# Patient Record
Sex: Male | Born: 1980 | Race: White | Hispanic: No | Marital: Married | Smoking: Current some day smoker
Health system: Southern US, Community
[De-identification: ages and names within clinical notes are randomized; demographics above are authoritative.]

## PROBLEM LIST (undated history)

## (undated) DIAGNOSIS — H02409 Unspecified ptosis of unspecified eyelid: Secondary | ICD-10-CM

## (undated) HISTORY — DX: Unspecified ptosis of unspecified eyelid: H02.409

---
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
## 2014-02-08 ENCOUNTER — Encounter: Payer: Self-pay | Admitting: Neurology

## 2014-02-09 ENCOUNTER — Encounter: Payer: Self-pay | Admitting: Neurology

## 2014-02-09 ENCOUNTER — Encounter (INDEPENDENT_AMBULATORY_CARE_PROVIDER_SITE_OTHER): Payer: Self-pay

## 2014-02-09 ENCOUNTER — Ambulatory Visit (INDEPENDENT_AMBULATORY_CARE_PROVIDER_SITE_OTHER): Payer: BC Managed Care – PPO | Admitting: Neurology

## 2014-02-09 VITALS — BP 118/75 | HR 64 | Ht 69.0 in | Wt 174.0 lb

## 2014-02-09 DIAGNOSIS — H02409 Unspecified ptosis of unspecified eyelid: Secondary | ICD-10-CM | POA: Insufficient documentation

## 2014-02-09 NOTE — Progress Notes (Signed)
[facility]    Provider:  Dr Hosie PoissonSumner Referring Provider: Farris HasMorrow, Aaron, MD Primary Care Physician:  Farris HasMORROW, AARON, MD  CC: ptosis  HPI:  Derek Bird is a 33 y.o. male here as a referral from Dr. Kateri PlummerMorrow for ptosis evaluation  Notes drooping of his left eyelid. Initially noticed symptoms a few years ago, progressively worse the past few months. Notes it gets worse as the day goes on, when fatigued or having a drink. Also notes a sensation of pressure behind his eye and sensation of a heavy eyelid. On severe days he will notice some drooping but more sensation of drooping. No change with vision. No noted swelling around his eye. No noted conjunctival injection. No noted change in pupil size. Does note some generalized fatigue as the day goes on. No noted headaches. Does have a history of palpitations, typically around once per week. No episodes of diaphoresis. Of note, he describes worsening of symptoms with EtOH intake.   Overall healthy. No family history of autoimmune or neurological disorders.   Review of Systems: Out of a complete 14 system review, the patient complains of only the following symptoms, and all other reviewed systems are negative. + eye pain, palpitations, anxiety, allergies  History   Social History  . Marital Status: Married    Spouse Name: N/A    Number of Children: 0  . Years of Education: N/A   Occupational History  . Not on file.   Social History Main Topics  . Smoking status: Current Some Day Smoker    Types: Cigars  . Smokeless tobacco: Never Used  . Alcohol Use: Yes     Comment: beer, wine 8-10 drinks a week  . Drug Use: No  . Sexual Activity: Not on file   Other Topics Concern  . Not on file   Social History Narrative   Patient is married, no children   Engineer, maintenance (IT)College graduate, works as a Art gallery managerngineer   Right handed   No caffeine, drinks decaff    No family history on file.  Past Medical History  Diagnosis Date  . Ptosis      Past Surgical History  Procedure Laterality Date  . Nose surgery  1999    broken nose  . Shoulder surgery Left 2001    No current outpatient prescriptions on file.   No current facility-administered medications for this visit.    Allergies as of 02/09/2014 - Review Complete 02/09/2014  Allergen Reaction Noted  . Amoxicillin Rash 02/08/2014    Vitals: BP 118/75  Pulse 64  Ht 5\' 9"  (1.753 m)  Wt 174 lb (78.926 kg)  BMI 25.68 kg/m2 Last Weight:  Wt Readings from Last 1 Encounters:  02/09/14 174 lb (78.926 kg)   Last Height:   Ht Readings from Last 1 Encounters:  02/09/14 5\' 9"  (1.753 m)     Physical exam: Exam: Gen: NAD, conversant Eyes: anicteric sclerae, moist conjunctivae HENT: Atraumatic, oropharynx clear Neck: Trachea midline; supple,  Lungs: CTA, no wheezing, rales, rhonic                          CV: RRR, no MRG Abdomen: Soft, non-tender;  Extremities: No peripheral edema  Skin: Normal temperature, no rash,  Psych: Appropriate affect, pleasant  Neuro: MS: AA&Ox3, appropriately interactive, normal affect   Speech: fluent w/o paraphasic error  Memory: good recent and remote recall  CN: PERRL, EOMI no nystagmus, minimal ptosis in L eyelid, no worsening or  double vision with sustained up gaze, sensation intact to LT V1-V3 bilat, face symmetric, no weakness, hearing grossly intact, palate elevates symmetrically, shoulder shrug 5/5 bilat,  tongue protrudes midline, no fasiculations noted.  Motor: normal bulk and tone Strength: 5/5  In all extremities  Coord: rapid alternating and point-to-point (FNF, HTS) movements intact.  Reflexes: symmetrical, bilat downgoing toes  Sens: LT intact in all extremities  Gait: posture, stance, stride and arm-swing normal. Tandem gait intact. Able to walk on heels and toes. Romberg absent.   Assessment:  After physical and neurologic examination, review of laboratory studies, imaging, neurophysiology testing and  pre-existing records, assessment will be reviewed on the problem list.  Plan:  Treatment plan and additional workup will be reviewed under Problem List.  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 Symptoms worsen with fatigue and EtOH intake. Physical exam pertinent for very mild L eye ptosis, otherwise unremarkable. Of note, eye movements are intact and there was no pupil size abnormality making a central compressive process less likely. Based on history would have concern for Myasthenia Gravis or another autoimmune disorder. Will check MG panel, VGCC, TSH, Lyme, ANA. Follow up once lab workup completed. Patient is scheduled to see eye doctor today.    Elspeth Cho, DO  [facility] [address] [place], [place] [address]  Phone [phone] Fax [phone]

## 2014-02-09 NOTE — Patient Instructions (Signed)
Overall you are doing fairly well but I do want to suggest a few things today:   Remember to drink plenty of fluid, eat healthy meals and do not skip any meals. Try to eat protein with a every meal and eat a healthy snack such as fruit or nuts in between meals. Try to keep a regular sleep-wake schedule and try to exercise daily, particularly in the form of walking, 20-30 minutes a day, if you can.   As far as diagnostic testing: 1)Please have some blood work checked today prior to leaving.    I will call you with the blood work results. Please call us with any interim questions, concerns, problems, updates or refill requests.   My clinical assistant and will answer any of your questions and relay your messages to me and also relay most of my messages to you.   Our phone number is [phone]. We also have an after hours call service for urgent matters and there is a physician on-call for urgent questions. For any emergencies you know to call 911 or go to the nearest emergency room

## 2014-02-09 NOTE — Addendum Note (Signed)
Addended by: Ramond MarrowSUMNER, Diangelo Radel J on: 02/09/2014 11:02 AM   Modules accepted: Orders

## 2014-02-14 NOTE — Progress Notes (Signed)
Quick Note:  Left message on patient's voice mail, of normal labs, patient was also instructed to fax eye exam results to our office [phone] ______

## 2014-02-15 LAB — VGCC ANTIBODY: VGCC Antibody: NEGATIVE

## 2014-02-15 LAB — ACETYLCHOLINE RECEPTOR, BINDING

## 2014-02-15 LAB — STRIATIONAL ANTIBODIES: Anti-striation Abs: NEGATIVE

## 2014-02-15 LAB — LYME, TOTAL AB TEST/REFLEX: Lyme IgG/IgM Ab: <0.91 {ISR} (ref 0.00–0.90)

## 2014-02-15 LAB — ANA W/REFLEX IF POSITIVE: ANA: NEGATIVE

## 2014-02-15 LAB — TSH: TSH: 1.35 u[IU]/mL (ref 0.450–4.500)

## 2014-04-06 ENCOUNTER — Telehealth: Payer: Self-pay | Admitting: Neurology

## 2014-04-06 NOTE — Telephone Encounter (Signed)
Patient calling to get results from b/w and eye exam evaluation.  Please return call.  Thanks

## 2014-04-06 NOTE — Telephone Encounter (Signed)
Pt calling requesting results from b/w and eye exam evaluation. Please advise

## 2014-04-06 NOTE — Telephone Encounter (Signed)
Please see Derek Bird's note from 3/25. Please let him know his labs were normal. I do not have a copy of his eye evaluation. He would either need to discuss that with his eye doctor or have a copy sent to us.

## 2014-04-10 NOTE — Telephone Encounter (Signed)
Called and left message informing pt that his lab work results were normal and that the pt needed to contact his PCP concerning his eye evaluation because a copy of it was not sent to Dr. Hosie PoissonSumner and if the pt has any other problems, questions or concerns to call the office.

## 2014-06-28 NOTE — Telephone Encounter (Signed)
Noted  

## 2014-10-03 ENCOUNTER — Other Ambulatory Visit: Payer: Self-pay | Admitting: *Deleted

## 2014-10-03 DIAGNOSIS — R002 Palpitations: Secondary | ICD-10-CM

## 2014-10-03 NOTE — Progress Notes (Signed)
Ordered at request of Dr Farris HasAaron Morrow, MD at [facility] Phone [phone] fax [phone]

## 2014-10-04 ENCOUNTER — Encounter: Payer: Self-pay | Admitting: *Deleted

## 2014-10-04 ENCOUNTER — Encounter (INDEPENDENT_AMBULATORY_CARE_PROVIDER_SITE_OTHER): Payer: BC Managed Care – PPO

## 2014-10-04 DIAGNOSIS — R002 Palpitations: Secondary | ICD-10-CM

## 2014-10-04 NOTE — Progress Notes (Signed)
Patient ID: Derek Bird, male   DOB: 30-Nov-1980, 33 y.o.   MRN: [id number] [facility] 48 hour holter monitor applied to patient.

## 2015-04-17 ENCOUNTER — Other Ambulatory Visit: Payer: Self-pay | Admitting: Family Medicine

## 2015-04-17 DIAGNOSIS — N50812 Left testicular pain: Secondary | ICD-10-CM

## 2015-04-18 ENCOUNTER — Other Ambulatory Visit: Payer: Self-pay | Admitting: Family Medicine

## 2015-04-18 DIAGNOSIS — N50812 Left testicular pain: Secondary | ICD-10-CM

## 2015-04-23 ENCOUNTER — Ambulatory Visit
Admission: RE | Admit: 2015-04-23 | Discharge: 2015-04-23 | Disposition: A | Discharge status: Home / Self Care | Payer: BLUE CROSS/BLUE SHIELD | Source: Ambulatory Visit | Attending: Family Medicine | Admitting: Family Medicine

## 2015-04-23 DIAGNOSIS — N50812 Left testicular pain: Secondary | ICD-10-CM

## 2015-05-24 ENCOUNTER — Other Ambulatory Visit: Payer: Self-pay | Admitting: Family Medicine

## 2015-05-24 ENCOUNTER — Ambulatory Visit
Admission: RE | Admit: 2015-05-24 | Discharge: 2015-05-24 | Disposition: A | Discharge status: Home / Self Care | Payer: BLUE CROSS/BLUE SHIELD | Source: Ambulatory Visit | Attending: Family Medicine | Admitting: Family Medicine

## 2015-05-24 DIAGNOSIS — R059 Cough, unspecified: Secondary | ICD-10-CM

## 2015-05-24 DIAGNOSIS — R05 Cough: Secondary | ICD-10-CM

## 2016-01-14 ENCOUNTER — Encounter (HOSPITAL_COMMUNITY): Payer: Self-pay | Admitting: Emergency Medicine

## 2016-01-14 ENCOUNTER — Emergency Department (HOSPITAL_COMMUNITY)
Admission: EM | Admit: 2016-01-14 | Discharge: 2016-01-14 | Disposition: A | Discharge status: Home / Self Care | Payer: Managed Care, Other (non HMO) | Attending: Emergency Medicine | Admitting: Emergency Medicine

## 2016-01-14 DIAGNOSIS — Z8669 Personal history of other diseases of the nervous system and sense organs: Secondary | ICD-10-CM | POA: Diagnosis not present

## 2016-01-14 DIAGNOSIS — Z23 Encounter for immunization: Secondary | ICD-10-CM | POA: Insufficient documentation

## 2016-01-14 DIAGNOSIS — S51851A Open bite of right forearm, initial encounter: Secondary | ICD-10-CM | POA: Insufficient documentation

## 2016-01-14 DIAGNOSIS — Y9389 Activity, other specified: Secondary | ICD-10-CM | POA: Diagnosis not present

## 2016-01-14 DIAGNOSIS — Y998 Other external cause status: Secondary | ICD-10-CM | POA: Insufficient documentation

## 2016-01-14 DIAGNOSIS — W5501XA Bitten by cat, initial encounter: Secondary | ICD-10-CM | POA: Diagnosis not present

## 2016-01-14 DIAGNOSIS — Z880 Allergy status to penicillin: Secondary | ICD-10-CM | POA: Insufficient documentation

## 2016-01-14 DIAGNOSIS — Y9289 Other specified places as the place of occurrence of the external cause: Secondary | ICD-10-CM | POA: Insufficient documentation

## 2016-01-14 DIAGNOSIS — F17210 Nicotine dependence, cigarettes, uncomplicated: Secondary | ICD-10-CM | POA: Insufficient documentation

## 2016-01-14 MED ORDER — CLINDAMYCIN HCL 150 MG PO CAPS: 300.0000 mg | ORAL_CAPSULE | Freq: Three times a day (TID) | ORAL | Status: AC

## 2016-01-14 MED ORDER — CLINDAMYCIN HCL 300 MG PO CAPS
300.0000 mg | ORAL_CAPSULE | Freq: Once | ORAL | Status: AC
  Administered 2016-01-14: 300 mg via ORAL
  Filled 2016-01-14: qty 1

## 2016-01-14 MED ORDER — CIPROFLOXACIN HCL 500 MG PO TABS: 500.0000 mg | ORAL_TABLET | Freq: Two times a day (BID) | ORAL | Status: AC

## 2016-01-14 MED ORDER — RABIES IMMUNE GLOBULIN 150 UNIT/ML IM INJ
20.0000 [IU]/kg | INJECTION | Freq: Once | INTRAMUSCULAR | Status: AC
  Administered 2016-01-14: 1650 [IU] via INTRAMUSCULAR
  Filled 2016-01-14: qty 11

## 2016-01-14 MED ORDER — CIPROFLOXACIN HCL 500 MG PO TABS
500.0000 mg | ORAL_TABLET | Freq: Once | ORAL | Status: AC
  Administered 2016-01-14: 500 mg via ORAL
  Filled 2016-01-14: qty 1

## 2016-01-14 MED ORDER — RABIES VACCINE, PCEC IM SUSR
1.0000 mL | Freq: Once | INTRAMUSCULAR | Status: AC
  Administered 2016-01-14: 1 mL via INTRAMUSCULAR
  Filled 2016-01-14: qty 1

## 2016-01-14 MED ORDER — TETANUS-DIPHTH-ACELL PERTUSSIS 5-2.5-18.5 LF-MCG/0.5 IM SUSP
0.5000 mL | Freq: Once | INTRAMUSCULAR | Status: AC
  Administered 2016-01-14: 0.5 mL via INTRAMUSCULAR
  Filled 2016-01-14: qty 0.5

## 2016-01-14 NOTE — ED Provider Notes (Signed)
CSN: [id number]     Arrival date & time 01/14/16  1446 History  By signing my name below, I, Phillis Haggis, attest that this documentation has been prepared under the direction and in the presence of Sharilyn Sites, PA-C. Electronically Signed: Phillis Haggis, ED Scribe. 01/14/2016. 5:20 PM.   Chief Complaint  Patient presents with  . Animal Bite   The history is provided by the patient. No language interpreter was used.  HPI Comments: Derek Bird is a 35 y.o. male who presents to the Emergency Department complaining of a cat bite onset earlier this morning. Pt states that the cat bit him to the dorsal surface of the right hand. Cat is not vaccinated but is contained at his friend's house. Animal control was not called PTA, but states that the cat has never shown signs of rabies or other diseases. He states that his tdap is not UTD. He denies fever, chills, nausea, vomiting, numbness, or weakness. Pt is allergic to amoxicillin.   Past Medical History  Diagnosis Date  . Ptosis    Past Surgical History  Procedure Laterality Date  . Nose surgery  1999    broken nose  . Shoulder surgery Left 2001   History reviewed. No pertinent family history. Social History  Substance Use Topics  . Smoking status: Current Some Day Smoker    Types: Cigars  . Smokeless tobacco: Never Used  . Alcohol Use: Yes     Comment: beer, wine 8-10 drinks a week    Review of Systems  Constitutional: Negative for fever and chills.  Gastrointestinal: Negative for nausea and vomiting.  Skin: Positive for wound.  Neurological: Negative for weakness and numbness.  All other systems reviewed and are negative.  Allergies  Amoxicillin  Home Medications   Prior to Admission medications   Not on File   BP 149/92 mmHg  Pulse 86  Temp(Src) 98.4 F (36.9 C) (Oral)  Resp 18  SpO2 97%   Physical Exam  Constitutional: He is oriented to person, place, and time. He appears well-developed and well-nourished.   HENT:  Head: Normocephalic and atraumatic.  Mouth/Throat: Oropharynx is clear and moist.  Eyes: Conjunctivae and EOM are normal. Pupils are equal, round, and reactive to light.  Neck: Normal range of motion.  Cardiovascular: Normal rate, regular rhythm and normal heart sounds.   Pulmonary/Chest: Effort normal and breath sounds normal. No respiratory distress. He has no wheezes.  Abdominal: Soft. Bowel sounds are normal.  Musculoskeletal: Normal range of motion.  Cat bites noted to right dorsal forearm-- appears to be abrasions without puncture wounds; no bleeding or signs of retained FB currently; no swelling or bony deformities; arm is neurovascularly intact  Neurological: He is alert and oriented to person, place, and time.  Skin: Skin is warm and dry.  Psychiatric: He has a normal mood and affect.  Nursing note and vitals reviewed.   ED Course  Procedures (including critical care time) DIAGNOSTIC STUDIES: Oxygen Saturation is 97% on RA, normal by my interpretation.    COORDINATION OF CARE: 5:15 PM-Discussed treatment plan which includes antibiotics, updated tdap, and rabies shot series schedule with pt at bedside and pt agreed to plan.    Labs Review Labs Reviewed - No data to display  Imaging Review No results found. I have personally reviewed and evaluated these images and lab results as part of my medical decision-making.   EKG Interpretation None      MDM   Final diagnoses:  Cat bite of  forearm, right, initial encounter   35 year old male here following a cat bite to his dorsal right forearm. This was by his friend's cat who has not been vaccinated and animal control was not contacted.  Patient has a few abrasions to his right dorsal forearm, no puncture wounds. No bleeding or signs of retained foreign body at this time. Wounds were irrigated. Will need tetanus vaccination, rabies vaccination, and rabies immunoglobulin.  Patient has allergy to penicillins, CDC  recommends clindamycin and ciprofloxacin as substitute.  6:23 PM Called into patient's room because he began feeling lightheaded and nauseated after multiple shots.  Patient did not lose consciousness.  He does appear slightly pale.  He was laid back in chair, cool cloth applied to head, given water to drink.  Vitals updated.  Patient appears to have had a vaso-vagal reaction after injections.  Will monitor.  7:42 PM Patient feeling much better.  His vital have remained stable.  He has tolerated his oral medications and additional fluids without difficulty.  Feel he is stable for discharge.  Given schedule for remainder of rabies vaccines-- reminded that if he does not complete full series vaccinations may not be fully effective.  D/c home with clindamycin and ciprofloxacin given his penicillin allergy per CDC recommendations.  Discussed plan with patient, he/she acknowledged understanding and agreed with plan of care.  Return precautions given for new or worsening symptoms.  I personally performed the services described in this documentation, which was scribed in my presence. The recorded information has been reviewed and is accurate.  Garlon Hatchet, PA-C 01/14/16 1952  Tilden Fossa, MD 01/17/16 [phone]

## 2016-01-14 NOTE — ED Notes (Signed)
Pt states he was bitten by a friends cat this morning. Cat is not vaccinated but is contained at the friend's house. Area is small abrasion. Alert and oriented.

## 2016-01-14 NOTE — Discharge Instructions (Signed)
Take the prescribed medication as directed. Follow-up with the [facility] urgent care for the remainder of your vaccines. Dose 1-- given today Dose 2-- due on 01/16/16 Dose 3-- due on 01/20/16 Dose 4-- due on 01/27/16 IF YOU DO NOT GET ALL VACCINES YOUR TREATMENT MAY NOT BE FULLY EFFECTIVE! Return to the ED for new or worsening symptoms.  Rabies Vaccine: What You Need to Know WHAT IS RABIES?  Rabies is a serious disease. It is caused by a virus.  Rabies is mainly a disease of animals. Humans get rabies when they are bitten by infected animals.  At first there might not be any symptoms. But weeks, or even years after a bite, rabies can cause pain, fatigue, headaches, fever, and irritability. These are followed by seizures, hallucinations, and paralysis. Human rabies is almost always fatal.  Wild animals, especially bats, are the most common source of human rabies infection in the [place]. Skunks, raccoons, dogs, cats, coyotes, foxes, and other mammals can also transmit the disease.  Human rabies is rare in the [place]. There have been only 55 cases diagnosed since 1990. However, between 16,000 and 39,000 people are vaccinated each year as a precaution after animal bites. Also, rabies is far more common in other parts of the world, with about 40,000 to 70,000 rabies-related deaths worldwide each year. Bites from unvaccinated dogs cause most of these cases. Rabies vaccine can prevent rabies. RABIES VACCINE  Rabies vaccine is given to people at high risk of rabies to protect them if they are exposed. It can also prevent the disease if it is given to a person after they have been exposed.  Rabies vaccine is made from killed rabies virus. It cannot cause rabies. WHO SHOULD GET RABIES VACCINE AND WHEN? Preventive Vaccination (No Exposure)  People at high risk of exposure to rabies, such as veterinarians, Educational psychologist, rabies laboratory workers, spelunkers, and rabies biologics  production workers should be offered rabies vaccine.  The vaccine should also be considered for:  People whose activities bring them into frequent contact with rabies virus or with possibly rabid animals.  International travelers who are likely to come in contact with animals in parts of the world where rabies is common.  The pre-exposure schedule for rabies vaccination is 3 doses, given at the following times:  Dose 1: As appropriate.  Dose 2: 7 days after Dose 1.  Dose 3: 21 days or 28 days after Dose 1.  For laboratory workers and others who may be repeatedly exposed to rabies virus, periodic testing for immunity is recommended and booster doses should be given as needed. (Testing or booster doses are not recommended for travelers). Ask your doctor for details. Vaccination After an Exposure Anyone who has been bitten by an animal, or who otherwise may have been exposed to rabies, should clean the wound and see a doctor immediately. The doctor will determine if they need to be vaccinated. A person who is exposed and has never been vaccinated against rabies should get 4 doses of rabies vaccine: one dose right away and additional doses on the 3rd, 7th, and 14th days. They should also get another shot called Rabies Immune Globulin at the same time as the first dose.  A person who has been previously vaccinated should get 2 doses of rabies vaccine: one right away and another on the 3rd day. Rabies Immune Globulin is not needed. TELL YOUR DOCTOR IF: Talk with a doctor before getting rabies vaccine if you:  Ever  had a serious (life-threatening) allergic reaction to a previous dose of rabies vaccine or to any component of the vaccine; tell your doctor if you have any severe allergies.  Have a weakened immune system because of:  HIV, AIDS, or another disease that affects the immune system.  Treatment with drugs that affect the immune system, such as steroids.  Cancer or cancer treatment  with radiation or drugs. If you have a minor illness, such as a cold, you can be vaccinated. If you are moderately or severely ill, you should probably wait until you recover before getting a routine (non-exposure) dose of rabies vaccine. If you have been exposed to rabies virus, you should get the vaccine regardless of any other illnesses you may have. WHAT ARE THE RISKS FROM RABIES VACCINE? A vaccine, like any medicine, is capable of causing serious problems, such as severe allergic reactions. The risk of a vaccine causing serious harm, or death, is extremely small. Serious problems from rabies vaccine are very rare.  Mild problems:  Soreness, redness, swelling, or itching where the shot was given (30% to 74%).  Headache, nausea, abdominal pain, muscle aches, or dizziness (5% to 40%). Moderate problems:  Hives, pain in the joints, or fever (about 6% of booster doses).  Other nervous system disorders, such as Guillain-Barr Syndrome (GBS), have been reported after rabies vaccine, but this happens so rarely that it is not known whether they are related to the vaccine. Note: Several brands of rabies vaccine are available in the [place], and reactions may vary between brands. Your provider can give you more information about a particular brand. WHAT IF THERE IS A SERIOUS REACTION? What should I look for? Look for anything that concerns you, such as signs of a severe allergic reaction, very high fever, or behavior changes.  Signs of a severe allergic reaction can include hives, swelling of the face and throat, difficulty breathing, a fast heartbeat, dizziness, and weakness. These would start a few minutes to a few hours after the vaccination. What should I do?  If you think it is a severe allergic reaction or other emergency that cannot wait, call 911 or get the person to the nearest hospital. Otherwise, call your doctor.  Afterward, the reaction should be reported to the Vaccine Adverse  Event Reporting System (VAERS). Your doctor might file this report, or you can do it yourself through the VAERS website at www.vaers.LAgents.no or by calling 1-202-654-0023. VAERS is only for reporting reactions. They do not give medical advice. HOW CAN I LEARN MORE?  Ask your doctor.  Call your local or state health department.  Contact the Centers for Disease Control and Prevention (CDC):  Visit the CDC rabies website at wwwcrv.com CDC Rabies Vaccine VIS (08/28/08)   This information is not intended to replace advice given to you by your health care provider. Make sure you discuss any questions you have with your health care provider.   Document Released: 09/06/2006 Document Revised: 03/26/2015 Document Reviewed: 03/01/2013 Elsevier Interactive Patient Education [employer]

## 2016-01-14 NOTE — ED Notes (Signed)
Pt c/o feeling lightheaded and c/o nausea after injections. Placed in reclining position. PA at bedside

## 2016-01-18 ENCOUNTER — Encounter (HOSPITAL_COMMUNITY): Payer: Self-pay | Admitting: Emergency Medicine

## 2016-01-18 ENCOUNTER — Emergency Department (INDEPENDENT_AMBULATORY_CARE_PROVIDER_SITE_OTHER)
Admission: EM | Admit: 2016-01-18 | Discharge: 2016-01-18 | Disposition: A | Discharge status: Home / Self Care | Payer: Managed Care, Other (non HMO) | Source: Home / Self Care

## 2016-01-18 DIAGNOSIS — Z203 Contact with and (suspected) exposure to rabies: Secondary | ICD-10-CM | POA: Diagnosis not present

## 2016-01-18 MED ORDER — RABIES VACCINE, PCEC IM SUSR
INTRAMUSCULAR | Status: AC
  Filled 2016-01-18: qty 1

## 2016-01-18 MED ORDER — RABIES VACCINE, PCEC IM SUSR
1.0000 mL | Freq: Once | INTRAMUSCULAR | Status: AC
  Administered 2016-01-18: 1 mL via INTRAMUSCULAR

## 2016-01-18 NOTE — ED Notes (Signed)
The patient presented to the [facility] to receive his second set of rabies vaccines with the initial being on 01/14/2016 at [facility] ED.

## 2016-02-21 ENCOUNTER — Ambulatory Visit
Admission: RE | Admit: 2016-02-21 | Discharge: 2016-02-21 | Disposition: A | Discharge status: Home / Self Care | Payer: Managed Care, Other (non HMO) | Source: Ambulatory Visit | Attending: Family Medicine | Admitting: Family Medicine

## 2016-02-21 ENCOUNTER — Other Ambulatory Visit: Payer: Self-pay | Admitting: Family Medicine

## 2016-02-21 DIAGNOSIS — M25562 Pain in left knee: Secondary | ICD-10-CM

## 2016-10-24 IMAGING — CR DG CHEST 2V
2 series · 2 of 2 positions shown · non-contrast
Comparison: None.

CLINICAL DATA: Cough, short of breath

EXAM:
CHEST  2 VIEW

[w chest pa]
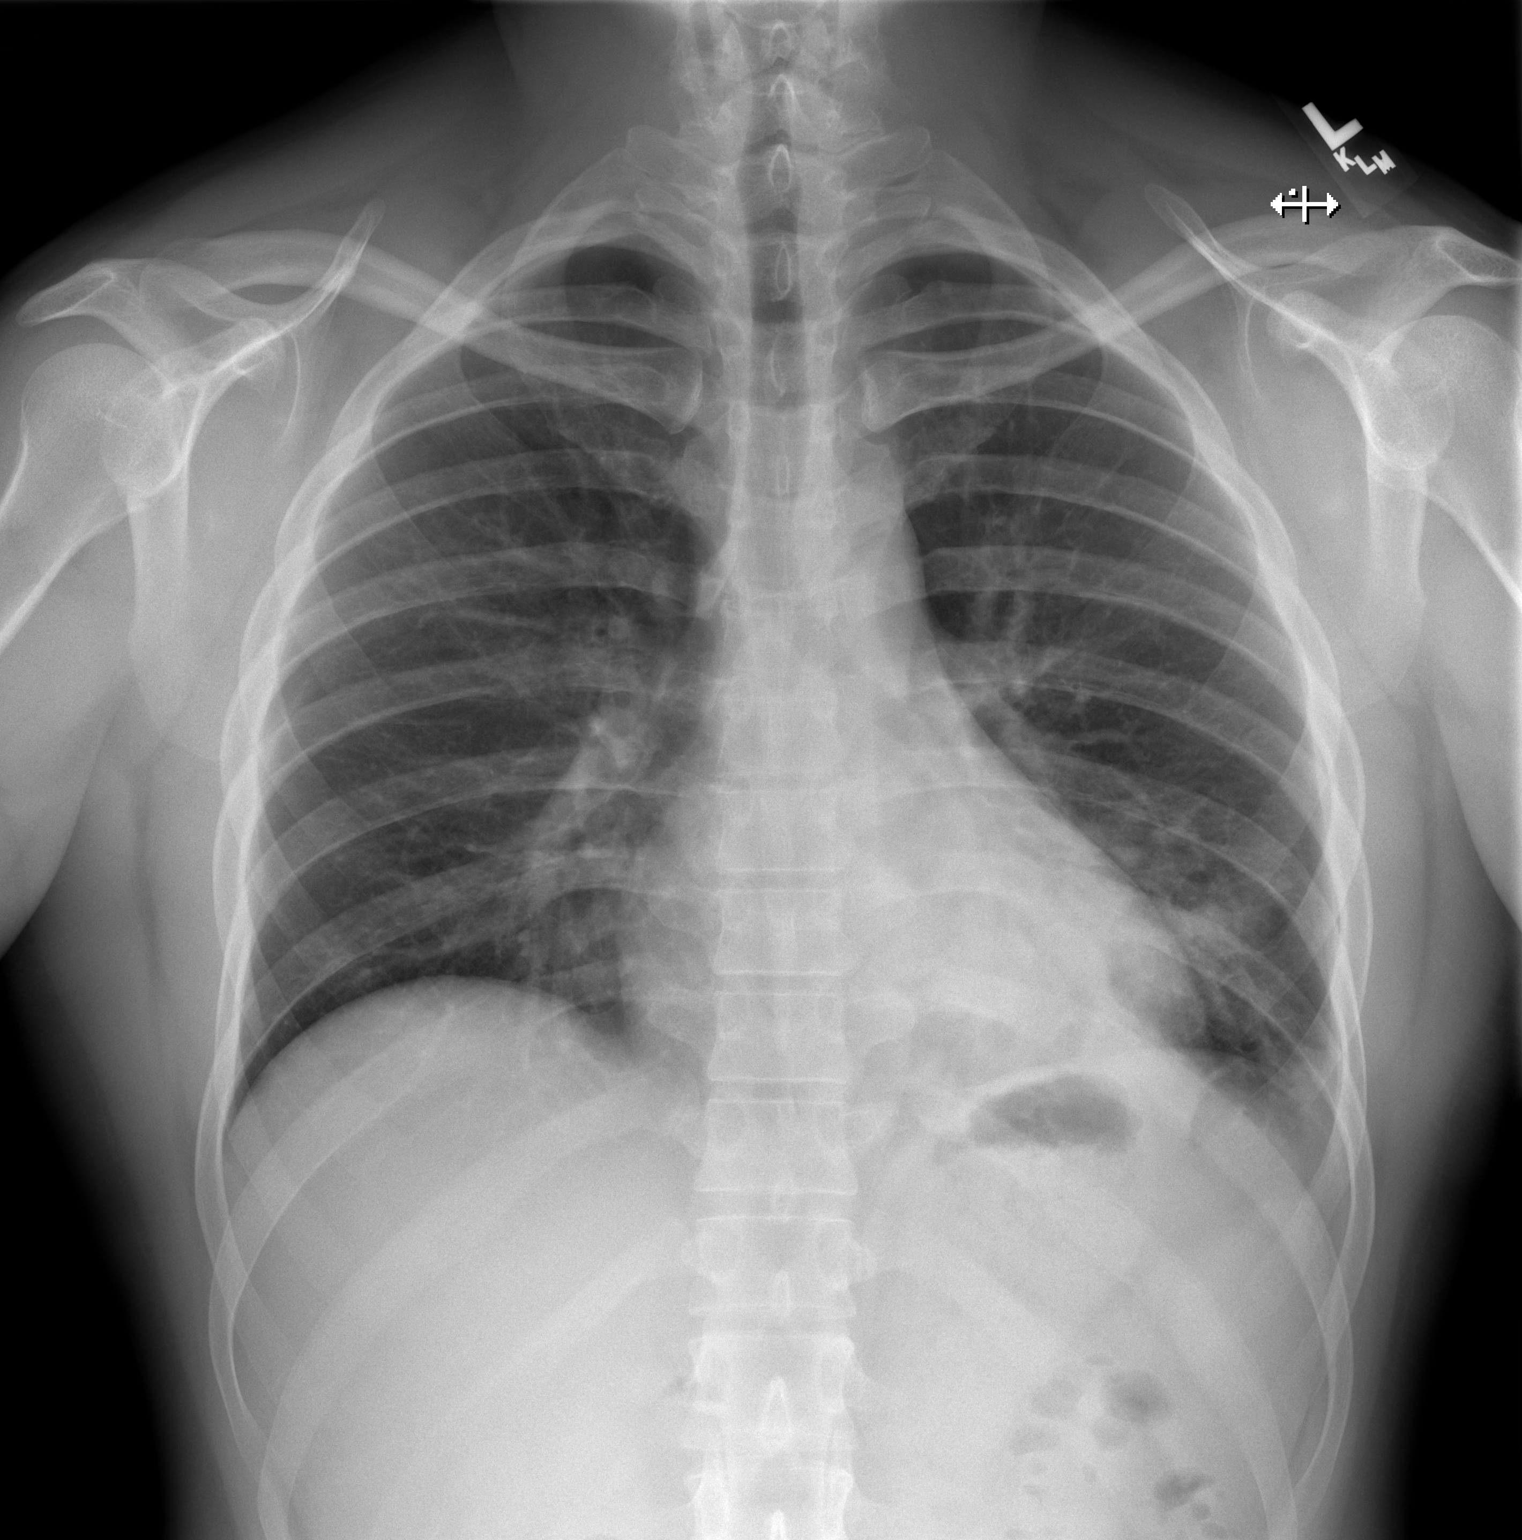

[w chest lat]
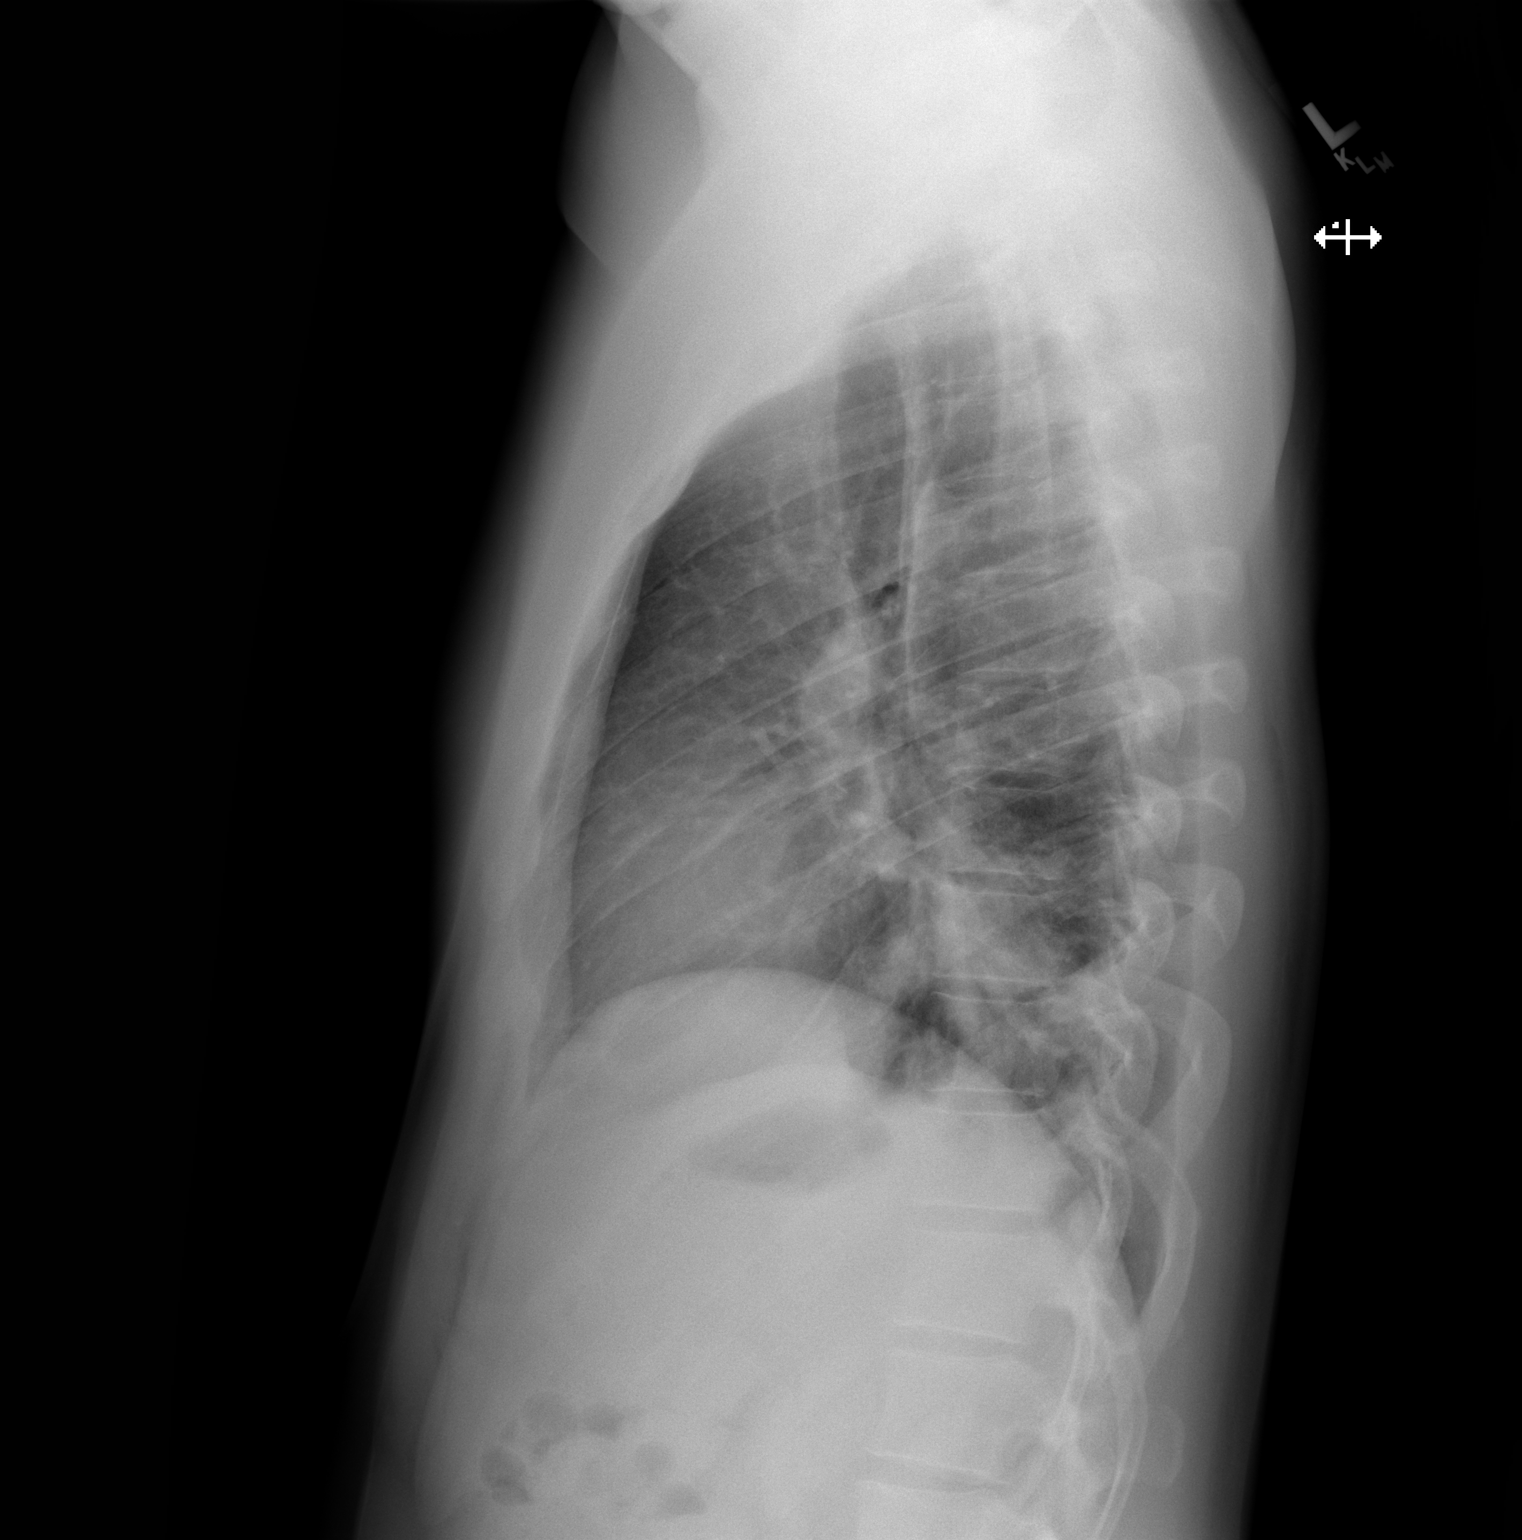

[2 of 2 positions shown; findings below may reference images not displayed]

FINDINGS: Normal cardiac silhouette. There is a left lower lobe opacity and a
segmental pattern consists with nodular airspace disease. Right lung
is clear. No osseous abnormality
IMPRESSION: Left lower lobe pneumonia.
